# Patient Record
Sex: Female | Born: 1945 | Race: White | Hispanic: No | Marital: Married | Smoking: Never smoker
Health system: Southern US, Community
[De-identification: ages and names within clinical notes are randomized; demographics above are authoritative.]

## PROBLEM LIST (undated history)

## (undated) DIAGNOSIS — C801 Malignant (primary) neoplasm, unspecified: Secondary | ICD-10-CM

## (undated) DIAGNOSIS — E119 Type 2 diabetes mellitus without complications: Secondary | ICD-10-CM

## (undated) DIAGNOSIS — I10 Essential (primary) hypertension: Secondary | ICD-10-CM

## (undated) HISTORY — PX: MANDIBLE FRACTURE SURGERY: SHX706

---

## 2015-11-27 ENCOUNTER — Emergency Department (HOSPITAL_BASED_OUTPATIENT_CLINIC_OR_DEPARTMENT_OTHER): Payer: Medicare Other

## 2015-11-27 ENCOUNTER — Emergency Department (HOSPITAL_BASED_OUTPATIENT_CLINIC_OR_DEPARTMENT_OTHER)
Admission: EM | Admit: 2015-11-27 | Discharge: 2015-11-27 | Disposition: A | Discharge status: Home / Self Care | Payer: Medicare Other | Attending: Emergency Medicine | Admitting: Emergency Medicine

## 2015-11-27 ENCOUNTER — Encounter (HOSPITAL_BASED_OUTPATIENT_CLINIC_OR_DEPARTMENT_OTHER): Payer: Self-pay | Admitting: *Deleted

## 2015-11-27 DIAGNOSIS — Z79899 Other long term (current) drug therapy: Secondary | ICD-10-CM | POA: Diagnosis not present

## 2015-11-27 DIAGNOSIS — E119 Type 2 diabetes mellitus without complications: Secondary | ICD-10-CM | POA: Insufficient documentation

## 2015-11-27 DIAGNOSIS — E860 Dehydration: Secondary | ICD-10-CM | POA: Insufficient documentation

## 2015-11-27 DIAGNOSIS — I10 Essential (primary) hypertension: Secondary | ICD-10-CM | POA: Insufficient documentation

## 2015-11-27 DIAGNOSIS — R42 Dizziness and giddiness: Secondary | ICD-10-CM | POA: Diagnosis present

## 2015-11-27 DIAGNOSIS — Z7982 Long term (current) use of aspirin: Secondary | ICD-10-CM | POA: Insufficient documentation

## 2015-11-27 HISTORY — DX: Malignant (primary) neoplasm, unspecified: C80.1

## 2015-11-27 HISTORY — DX: Essential (primary) hypertension: I10

## 2015-11-27 HISTORY — DX: Type 2 diabetes mellitus without complications: E11.9

## 2015-11-27 LAB — BASIC METABOLIC PANEL
Anion gap: 10 (ref 5–15)
BUN: 15 mg/dL (ref 6–20)
CALCIUM: 9.1 mg/dL (ref 8.9–10.3)
CO2: 23 mmol/L (ref 22–32)
CREATININE: 0.95 mg/dL (ref 0.44–1.00)
Chloride: 101 mmol/L (ref 101–111)
GFR calc Af Amer: >60 mL/min (ref >60)
GFR, EST NON AFRICAN AMERICAN: 60 mL/min — AB (ref >60)
GLUCOSE: 237 mg/dL — AB (ref 65–99)
Potassium: 3.9 mmol/L (ref 3.5–5.1)
Sodium: 134 mmol/L — ABNORMAL LOW (ref 135–145)

## 2015-11-27 LAB — URINALYSIS, ROUTINE W REFLEX MICROSCOPIC
Bilirubin Urine: NEGATIVE
Glucose, UA: 100 mg/dL — AB
HGB URINE DIPSTICK: NEGATIVE
Ketones, ur: NEGATIVE mg/dL
NITRITE: NEGATIVE
Protein, ur: NEGATIVE mg/dL
Specific Gravity, Urine: 1.008 (ref 1.005–1.030)
pH: 6 (ref 5.0–8.0)

## 2015-11-27 LAB — CBC
HCT: 33.8 % — ABNORMAL LOW (ref 36.0–46.0)
Hemoglobin: 11.4 g/dL — ABNORMAL LOW (ref 12.0–15.0)
MCH: 28.9 pg (ref 26.0–34.0)
MCHC: 33.7 g/dL (ref 30.0–36.0)
MCV: 85.8 fL (ref 78.0–100.0)
Platelets: 318 10*3/uL (ref 150–400)
RBC: 3.94 MIL/uL (ref 3.87–5.11)
RDW: 13.9 % (ref 11.5–15.5)
WBC: 8.4 10*3/uL (ref 4.0–10.5)

## 2015-11-27 LAB — HEPATIC FUNCTION PANEL
ALK PHOS: 95 U/L (ref 38–126)
ALT: 12 U/L — AB (ref 14–54)
AST: 25 U/L (ref 15–41)
Albumin: 3.8 g/dL (ref 3.5–5.0)
BILIRUBIN DIRECT: 0.1 mg/dL (ref 0.1–0.5)
Indirect Bilirubin: 0.6 mg/dL (ref 0.3–0.9)
Total Bilirubin: 0.7 mg/dL (ref 0.3–1.2)
Total Protein: 7.2 g/dL (ref 6.5–8.1)

## 2015-11-27 LAB — URINE MICROSCOPIC-ADD ON: RBC / HPF: NONE SEEN RBC/hpf (ref 0–5)

## 2015-11-27 LAB — CBG MONITORING, ED: GLUCOSE-CAPILLARY: 248 mg/dL — AB (ref 65–99)

## 2015-11-27 MED ORDER — ONDANSETRON HCL 4 MG/2ML IJ SOLN
4.0000 mg | Freq: Once | INTRAMUSCULAR | Status: AC
  Administered 2015-11-27: 4 mg via INTRAVENOUS
  Filled 2015-11-27: qty 2

## 2015-11-27 MED ORDER — SODIUM CHLORIDE 0.9 % IV BOLUS (SEPSIS)
1000.0000 mL | Freq: Once | INTRAVENOUS | Status: AC
  Administered 2015-11-27: 1000 mL via INTRAVENOUS

## 2015-11-27 MED ORDER — SODIUM CHLORIDE 0.9 % IV BOLUS (SEPSIS)
2000.0000 mL | Freq: Once | INTRAVENOUS | Status: AC
Start: 2015-11-27 — End: 2015-11-27
  Administered 2015-11-27: 2000 mL via INTRAVENOUS

## 2015-11-27 MED ORDER — MECLIZINE HCL 25 MG PO TABS
25.0000 mg | ORAL_TABLET | Freq: Once | ORAL | Status: AC
  Administered 2015-11-27: 25 mg via ORAL
  Filled 2015-11-27: qty 1

## 2015-11-27 NOTE — ED Provider Notes (Signed)
CSN: [id number]     Arrival date & time 11/27/15  1549 History  By signing my name below, I, Altamease Oiler, attest that this documentation has been prepared under the direction and in the presence of Merrily Pew, MD. Electronically Signed: Altamease Oiler, ED Scribe. 11/27/2015. 7:21 PM   No chief complaint on file.  The history is provided by the patient. No language interpreter was used.   Shari Thompson is a 70 y.o. 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 The sensation has not worsened since initial onset. The dizziness is worse with walking and she feels unsteady on her feet. Associated symptoms include nausea and emesis for 2 days and an episode of chills yesterday. Pt denies any recent falls, fever, headache, chest pain, SOB, cough, abdominal pain, dysuria, increased frequency, hematuria, diarrhea, and rash. No known sick contact or recent suspicious food intake, though she has been eating out. Her blood sugar is controlled with an insulin pump and was 145 at her last check. The pt was seen at an [facility] clinic and referred to the ED for a CT scan of the head. She is visiting the area from [place]. NKDA.   Past Medical History  Diagnosis Date  . Cancer ([facility])   . Hypertension   . Diabetes mellitus without complication [facility])    Past Surgical History  Procedure Laterality Date  . Mandible fracture surgery     No family history on file. Social History  Substance Use Topics  . Smoking status: Never Smoker   . Smokeless tobacco: None  . Alcohol Use: No   OB History    No data available     Review of Systems  Constitutional: Positive for chills. Negative for fever.  Respiratory: Negative for cough and shortness of breath.   Cardiovascular: Negative for chest pain.  Gastrointestinal: Positive for nausea and  vomiting. Negative for abdominal pain, diarrhea and constipation.  Genitourinary: Negative for dysuria, frequency and hematuria.  Skin: Negative for rash.  Neurological: Positive for dizziness.  All other systems reviewed and are negative.     Allergies  Review of patient's allergies indicates no known allergies.  Home Medications   Prior to Admission medications   Medication Sig Start Date End Date Taking? Authorizing Provider  amLODipine (NORVASC) 10 MG tablet Take 10 mg by mouth daily.   Yes Historical Provider, MD  aspirin 81 MG tablet Take 81 mg by mouth daily.   Yes Historical Provider, MD  calcium acetate (PHOSLO) 667 MG capsule Take by mouth 3 (three) times daily with meals.   Yes Historical Provider, MD  clopidogrel (PLAVIX) 75 MG tablet Take 75 mg by mouth daily.   Yes Historical Provider, MD  fluticasone (FLOVENT DISKUS) 50 MCG/BLIST diskus inhaler Inhale 1 puff into the lungs 2 (two) times daily.   Yes Historical Provider, MD  insulin aspart (NOVOLOG) 100 UNIT/ML injection Inject into the skin 3 (three) times daily before meals.   Yes Historical Provider, MD  irbesartan (AVAPRO) 300 MG tablet Take 300 mg by mouth daily.   Yes Historical Provider, MD  Levothyroxine Sodium (SYNTHROID PO) Take by mouth.   Yes Historical Provider, MD  lovastatin (MEVACOR) 20 MG tablet Take 20 mg by mouth at bedtime.   Yes Historical Provider, MD  metoprolol (LOPRESSOR) 100 MG tablet Take 100 mg by mouth 2 (two) times  daily.   Yes Historical Provider, MD  Multiple Vitamin (MULTI VITAMIN PO) Take by mouth.   Yes Historical Provider, MD   BP 128/61 mmHg  Pulse 68  Temp(Src) 97.9 F (36.6 C) (Oral)  Resp 20  Ht 5' 0.5" (1.537 m)  Wt 155 lb (70.308 kg)  BMI 29.76 kg/m2  SpO2 99% Physical Exam  Constitutional: She appears well-developed and well-nourished.  HENT:  Head: Normocephalic and atraumatic.  Neck: Normal range of motion.  Cardiovascular: Normal rate and regular rhythm.    Pulmonary/Chest: No stridor. No respiratory distress.  Abdominal: She exhibits no distension.  Neurological: She is alert.  No altered mental status, able to give full seemingly accurate history.  Face is symmetric, EOM's intact, pupils equal and reactive, vision intact, tongue and uvula midline without deviation Upper and Lower extremity motor 5/5, intact pain perception in distal extremities, 2+ reflexes in biceps, patella and achilles tendons. Finger to nose normal, heel to shin normal. Walks without assistance or evident ataxia.    Nursing note and vitals reviewed.   ED Course  Procedures (including critical care time) DIAGNOSTIC STUDIES: Oxygen Saturation is 99% on RA,  normal by my interpretation.    COORDINATION OF CARE: 4:34 PM Discussed treatment plan which includes lab work and IVF with pt at bedside and pt agreed to plan.  6:29 PM I re-evaluated the patient and provided an update on the results of her lab work. She feels better. Plan for re-evaluation after ambulation in the hall.    Labs Review Labs Reviewed  BASIC METABOLIC PANEL - Abnormal; Notable for the following:    Sodium 134 (*)    Glucose, Bld 237 (*)    GFR calc non Af Amer 60 (*)    All other components within normal limits  CBC - Abnormal; Notable for the following:    Hemoglobin 11.4 (*)    HCT 33.8 (*)    All other components within normal limits  URINALYSIS, ROUTINE W REFLEX MICROSCOPIC (NOT AT [facility]) - Abnormal; Notable for the following:    Glucose, UA 100 (*)    Leukocytes, UA TRACE (*)    All other components within normal limits  HEPATIC FUNCTION PANEL - Abnormal; Notable for the following:    ALT 12 (*)    All other components within normal limits  URINE MICROSCOPIC-ADD ON - Abnormal; Notable for the following:    Squamous Epithelial / LPF 0-5 (*)    Bacteria, UA RARE (*)    All other components within normal limits  CBG MONITORING, ED - Abnormal; Notable for the following:     Glucose-Capillary 248 (*)    All other components within normal limits    Imaging Review Ct Head Wo Contrast  11/27/2015  CLINICAL DATA:  Dizziness for 2 days EXAM: CT HEAD WITHOUT CONTRAST TECHNIQUE: Contiguous axial images were obtained from the base of the skull through the vertex without intravenous contrast. COMPARISON:  None. FINDINGS: Bony calvarium is intact. No gross soft tissue abnormality is seen. Heavy calcification of the vertebral arteries is noted. Mild atrophic changes are seen. No findings to suggest acute hemorrhage, acute infarction or space-occupying mass lesion are noted. IMPRESSION: No acute abnormality noted. Electronically Signed   By: Inez Catalina M.D.   On: 11/27/2015 19:18   I personally reviewed and evaluated these images and lab results as a part of my medical decision-making.   EKG Interpretation   Date/Time:  Monday November 27 2015 16:18:55 EDT Ventricular Rate:  70  PR Interval:  158 QRS Duration: 107 QT Interval:  446 QTC Calculation: 481 R Axis:   14 Text Interpretation:  Sinus rhythm Low voltage, precordial leads  Borderline T abnormalities, anterior leads No significant change since  last tracing Confirmed by [facility] MD, Corene Cornea [phone]) on 11/27/2015 5:15:03 PM      MDM   Final diagnoses:  Dehydration   CT scan done secondary to her history of squamous cell carcinoma and is progressively worsening difficulty walking. However CT scan was negative for any kind of intracranial mass or bleed unlikely. Patient's symptoms improved significantly with a few liters of fluid and this is likely the cause of her symptoms. He was able to walk without any significant lightheadedness.  She will increase her by mouth intake very sure he has a prescription for meclizine which is very anti-medic and can also help with vertigo she has that. Will follow-up with her doctor when she gets home.  New Prescriptions: Discharge Medication List as of 11/27/2015  8:33 PM       I  have personally and contemperaneously reviewed labs and imaging and used in my decision making as above.   A medical screening exam was performed and I feel the patient has had an appropriate workup for their chief complaint at this time and likelihood of emergent condition existing is low and thus workup can continue on an outpatient basis.. Their vital signs are stable. They have been counseled on decision, discharge, follow up and which symptoms necessitate immediate return to the emergency department.  They verbally stated understanding and agreement with plan and discharged in stable condition.   I personally performed the services described in this documentation, which was scribed in my presence. The recorded information has been reviewed and is accurate.     Merrily Pew, MD 11/27/15 2124

## 2015-11-27 NOTE — ED Notes (Signed)
Pt ambulated in hallway. Still dizzy. Pt states she is not as dizzy as before but still dizzy and off balance.

## 2015-11-27 NOTE — ED Notes (Signed)
Pt ambulatory to bathroom with minimal assistance. Pt reports feeling better.

## 2015-11-27 NOTE — ED Notes (Signed)
Pt awaiting CD of CT at this time.

## 2015-11-27 NOTE — ED Notes (Signed)
She was seen at [facility] this am for dizziness and vomited that started yesterday while visiting from out of town. Her MD told her to come here for CT of her head. She is alert and oriented.

## 2015-11-28 ENCOUNTER — Emergency Department (HOSPITAL_COMMUNITY)
Admission: EM | Admit: 2015-11-28 | Discharge: 2015-11-28 | Disposition: A | Discharge status: Home / Self Care | Payer: Medicare Other | Attending: Emergency Medicine | Admitting: Emergency Medicine

## 2015-11-28 ENCOUNTER — Encounter (HOSPITAL_COMMUNITY): Payer: Self-pay | Admitting: Emergency Medicine

## 2015-11-28 DIAGNOSIS — E162 Hypoglycemia, unspecified: Secondary | ICD-10-CM

## 2015-11-28 DIAGNOSIS — Z79899 Other long term (current) drug therapy: Secondary | ICD-10-CM | POA: Diagnosis not present

## 2015-11-28 DIAGNOSIS — Z7982 Long term (current) use of aspirin: Secondary | ICD-10-CM | POA: Insufficient documentation

## 2015-11-28 DIAGNOSIS — Z792 Long term (current) use of antibiotics: Secondary | ICD-10-CM | POA: Diagnosis not present

## 2015-11-28 DIAGNOSIS — E10649 Type 1 diabetes mellitus with hypoglycemia without coma: Secondary | ICD-10-CM | POA: Diagnosis present

## 2015-11-28 DIAGNOSIS — I10 Essential (primary) hypertension: Secondary | ICD-10-CM | POA: Insufficient documentation

## 2015-11-28 LAB — CBC WITH DIFFERENTIAL/PLATELET
BASOS PCT: 0 %
Basophils Absolute: 0 10*3/uL (ref 0.0–0.1)
EOS PCT: 0 %
Eosinophils Absolute: 0 10*3/uL (ref 0.0–0.7)
HEMATOCRIT: 34 % — AB (ref 36.0–46.0)
Hemoglobin: 11.2 g/dL — ABNORMAL LOW (ref 12.0–15.0)
Lymphocytes Relative: 13 %
Lymphs Abs: 0.9 10*3/uL (ref 0.7–4.0)
MCH: 28.4 pg (ref 26.0–34.0)
MCHC: 32.9 g/dL (ref 30.0–36.0)
MCV: 86.3 fL (ref 78.0–100.0)
MONO ABS: 0.4 10*3/uL (ref 0.1–1.0)
MONOS PCT: 5 %
NEUTROS ABS: 6 10*3/uL (ref 1.7–7.7)
Neutrophils Relative %: 82 %
Platelets: 287 10*3/uL (ref 150–400)
RBC: 3.94 MIL/uL (ref 3.87–5.11)
RDW: 14.6 % (ref 11.5–15.5)
WBC: 7.2 10*3/uL (ref 4.0–10.5)

## 2015-11-28 LAB — COMPREHENSIVE METABOLIC PANEL
ALBUMIN: 3.9 g/dL (ref 3.5–5.0)
ALT: 15 U/L (ref 14–54)
ANION GAP: 8 (ref 5–15)
AST: 21 U/L (ref 15–41)
Alkaline Phosphatase: 91 U/L (ref 38–126)
BILIRUBIN TOTAL: 0.9 mg/dL (ref 0.3–1.2)
BUN: 12 mg/dL (ref 6–20)
CO2: 22 mmol/L (ref 22–32)
Calcium: 8.8 mg/dL — ABNORMAL LOW (ref 8.9–10.3)
Chloride: 110 mmol/L (ref 101–111)
Creatinine, Ser: 0.96 mg/dL (ref 0.44–1.00)
GFR calc Af Amer: >60 mL/min (ref >60)
GFR, EST NON AFRICAN AMERICAN: 59 mL/min — AB (ref >60)
GLUCOSE: 191 mg/dL — AB (ref 65–99)
POTASSIUM: 3.5 mmol/L (ref 3.5–5.1)
Sodium: 140 mmol/L (ref 135–145)
TOTAL PROTEIN: 6.8 g/dL (ref 6.5–8.1)

## 2015-11-28 LAB — URINALYSIS, ROUTINE W REFLEX MICROSCOPIC
BILIRUBIN URINE: NEGATIVE
Glucose, UA: 100 mg/dL — AB
Hgb urine dipstick: NEGATIVE
KETONES UR: NEGATIVE mg/dL
NITRITE: NEGATIVE
Protein, ur: NEGATIVE mg/dL
Specific Gravity, Urine: 1.012 (ref 1.005–1.030)
pH: 5.5 (ref 5.0–8.0)

## 2015-11-28 LAB — URINE MICROSCOPIC-ADD ON

## 2015-11-28 LAB — CBG MONITORING, ED
GLUCOSE-CAPILLARY: 147 mg/dL — AB (ref 65–99)
GLUCOSE-CAPILLARY: 235 mg/dL — AB (ref 65–99)
GLUCOSE-CAPILLARY: 276 mg/dL — AB (ref 65–99)
Glucose-Capillary: 218 mg/dL — ABNORMAL HIGH (ref 65–99)

## 2015-11-28 NOTE — ED Notes (Signed)
Bed: WA25 Expected date:  Expected time:  Means of arrival:  Comments: EMS hypoglycemia  

## 2015-11-28 NOTE — Progress Notes (Signed)
Pt states pcp is [facility] [address] [place] [phone] fax [phone] [email or web address]

## 2015-11-28 NOTE — ED Notes (Addendum)
Per [facility] pt from home co hypoglycemia 60, altered mental status, received half of D 50. Alert aand oriented x 4 upon arrival. Hx Type 1 diabetes. Cool diaphoretic and clammy at the scene per EMS. CBG increased to 156 after treatment.

## 2015-11-28 NOTE — Discharge Instructions (Signed)
You were seen and evaluated today for your low blood sugar. Likely this is the reason that your temperature was so low. Please keep a close eye on your glucose at home. Return with any worsening of symptoms or new concerns. Follow-up as closely as possible with her primary care physician and endocrinologist.  Hypoglycemia Hypoglycemia occurs when the glucose in your blood is too low. Glucose is a type of sugar that is your body's main energy source. Hormones, such as insulin and glucagon, control the level of glucose in the blood. Insulin lowers blood glucose and glucagon increases blood glucose. Having too much insulin in your blood stream, or not eating enough food containing sugar, can result in hypoglycemia. Hypoglycemia can happen to people with or without diabetes. It can develop quickly and can be a medical emergency.  CAUSES   Missing or delaying meals.  Not eating enough carbohydrates at meals.  Taking too much diabetes medicine.  Not timing your oral diabetes medicine or insulin doses with meals, snacks, and exercise.  Nausea and vomiting.  Certain medicines.  Severe illnesses, such as hepatitis, kidney disorders, and certain eating disorders.  Increased activity or exercise without eating something extra or adjusting medicines.  Drinking too much alcohol.  A nerve disorder that affects body functions like your heart rate, blood pressure, and digestion (autonomic neuropathy).  A condition where the stomach muscles do not function properly (gastroparesis). Therefore, medicines and food may not absorb properly.  Rarely, a tumor of the pancreas can produce too much insulin. SYMPTOMS   Hunger.  Sweating (diaphoresis).  Change in body temperature.  Shakiness.  Headache.  Anxiety.  Lightheadedness.  Irritability.  Difficulty concentrating.  Dry mouth.  Tingling or numbness in the hands or feet.  Restless sleep or sleep disturbances.  Altered speech and  coordination.  Change in mental status.  Seizures or prolonged convulsions.  Combativeness.  Drowsiness (lethargic).  Weakness.  Increased heart rate or palpitations.  Confusion.  Pale, gray skin color.  Blurred or double vision.  Fainting. DIAGNOSIS  A physical exam and medical history will be performed. Your caregiver may make a diagnosis based on your symptoms. Blood tests and other lab tests may be performed to confirm a diagnosis. Once the diagnosis is made, your caregiver will see if your signs and symptoms go away once your blood glucose is raised.  TREATMENT  Usually, you can easily treat your hypoglycemia when you notice symptoms.  Check your blood glucose. If it is less than 70 mg/dl, take one of the following:   3-4 glucose tablets.    cup juice.    cup regular soda.   1 cup skim milk.   -1 tube of glucose gel.   5-6 hard candies.   Avoid high-fat drinks or food that may delay a rise in blood glucose levels.  Do not take more than the recommended amount of sugary foods, drinks, gel, or tablets. Doing so will cause your blood glucose to go too high.   Wait 10-15 minutes and recheck your blood glucose. If it is still less than 70 mg/dl or below your target range, repeat treatment.   Eat a snack if it is more than 1 hour until your next meal.  There may be a time when your blood glucose may go so low that you are unable to treat yourself at home when you start to notice symptoms. You may need someone to help you. You may even faint or be unable to swallow. If you  cannot treat yourself, someone will need to bring you to the hospital.  [place]  If you have diabetes, follow your diabetes management plan by:  Taking your medicines as directed.  Following your exercise plan.  Following your meal plan. Do not skip meals. Eat on time.  Testing your blood glucose regularly. Check your blood glucose before and after exercise. If you  exercise longer or different than usual, be sure to check blood glucose more frequently.  Wearing your medical alert jewelry that says you have diabetes.  Identify the cause of your hypoglycemia. Then, develop ways to prevent the recurrence of hypoglycemia.  Do not take a hot bath or shower right after an insulin shot.  Always carry treatment with you. Glucose tablets are the easiest to carry.  If you are going to drink alcohol, drink it only with meals.  Tell friends or family members ways to keep you safe during a seizure. This may include removing hard or sharp objects from the area or turning you on your side.  Maintain a healthy weight. SEEK MEDICAL CARE IF:   You are having problems keeping your blood glucose in your target range.  You are having frequent episodes of hypoglycemia.  You feel you might be having side effects from your medicines.  You are not sure why your blood glucose is dropping so low.  You notice a change in vision or a new problem with your vision. SEEK IMMEDIATE MEDICAL CARE IF:   Confusion develops.  A change in mental status occurs.  The inability to swallow develops.  Fainting occurs.   This information is not intended to replace advice given to you by your health care provider. Make sure you discuss any questions you have with your health care provider.   Document Released: 06/10/2005 Document Revised: 06/15/2013 Document Reviewed: 02/14/2015 Elsevier Interactive Patient Education [employer].

## 2015-11-28 NOTE — ED Provider Notes (Signed)
CSN: [id number]     Arrival date & time 11/28/15  [id number] History   First MD Initiated Contact with Patient 11/28/15 [phone]     Chief Complaint  Patient presents with  . Hypoglycemia     (Consider location/radiation/quality/duration/timing/severity/associated sxs/prior Treatment) HPI Comments: 70 year old female with complicated past medical history including brittle diabetes type 1 diagnosed as an adult currently on an insulin pump presents for low blood sugar. The patient was seen at [facility] in the emergency department yesterday for dizziness and vomiting. At that time it was felt the patient was dehydrated and she was rehydrated with IV fluids with great improvement in her symptoms. Per family the patient has not been dizzy at home. This morning when her husband woke up he realized that she was clammy and checked her blood sugar and found it to be 29. Patient only ate one old potato soup last night. Otherwise has not eaten in about 2 days. EMS was called because she was not able to hold down while the juice whenever trying to get her sugar up at her daughter's house. She did receive half an amp of D50 en route with improvement in her mental status.   Past Medical History  Diagnosis Date  . Cancer ([place])   . Hypertension   . Diabetes mellitus without complication [facility])    Past Surgical History  Procedure Laterality Date  . Mandible fracture surgery     No family history on file. Social History  Substance Use Topics  . Smoking status: Never Smoker   . Smokeless tobacco: None  . Alcohol Use: No   OB History    No data available     Review of Systems  Constitutional: Positive for diaphoresis, appetite change and fatigue. Negative for fever.  HENT: Negative for congestion, postnasal drip and rhinorrhea.   Eyes: Negative for visual disturbance.  Respiratory: Negative for cough, chest tightness and shortness of breath.   Cardiovascular: Negative for chest pain and  palpitations.  Gastrointestinal: Positive for vomiting ("spitting out the guava juice"). Negative for nausea, abdominal pain and diarrhea.  Genitourinary: Negative for dysuria, urgency and hematuria.  Musculoskeletal: Negative for myalgias and back pain.  Skin: Negative for rash.  Neurological: Positive for light-headedness. Negative for dizziness, weakness and headaches.  Hematological: Does not bruise/bleed easily.      Allergies  Review of patient's allergies indicates no known allergies.  Home Medications   Prior to Admission medications   Medication Sig Start Date End Date Taking? Authorizing Provider  amLODipine (NORVASC) 10 MG tablet Take 10 mg by mouth daily.   Yes Historical Provider, MD  aspirin 81 MG tablet Take 81 mg by mouth daily.   Yes Historical Provider, MD  Calcium Carbonate (CALCIUM 600 PO) Take 1 tablet by mouth daily.   Yes Historical Provider, MD  cholecalciferol (VITAMIN D) 400 units TABS tablet Take 400 Units by mouth daily.   Yes Historical Provider, MD  clopidogrel (PLAVIX) 75 MG tablet Take 75 mg by mouth daily.   Yes Historical Provider, MD  desloratadine (CLARINEX) 5 MG tablet Take 5 mg by mouth daily.   Yes Historical Provider, MD  fluticasone (FLONASE) 50 MCG/ACT nasal spray Place 1 spray into both nostrils daily.   Yes Historical Provider, MD  furosemide (LASIX) 20 MG tablet Take 20 mg by mouth daily as needed for fluid or edema.   Yes Historical Provider, MD  Insulin Human (INSULIN PUMP) SOLN Inject 0-100 each into the skin continuous as  needed (per CBG). novolog  Basal rate every hour . Dial in rate with meals if needed   Yes Historical Provider, MD  irbesartan (AVAPRO) 300 MG tablet Take 300 mg by mouth daily.   Yes Historical Provider, MD  levothyroxine (SYNTHROID, LEVOTHROID) 88 MCG tablet Take 88 mcg by mouth daily before breakfast.   Yes Historical Provider, MD  lovastatin (MEVACOR) 20 MG tablet Take 20 mg by mouth at bedtime.   Yes Historical  Provider, MD  metoprolol succinate (TOPROL-XL) 100 MG 24 hr tablet Take 100 mg by mouth daily. 09/22/15  Yes Historical Provider, MD  Multiple Vitamin (MULTI VITAMIN PO) Take 1 tablet by mouth daily.    Yes Historical Provider, MD  mupirocin ointment (BACTROBAN) 2 % Apply 1 application topically 2 (two) times daily. Apply to right hallux 11/19/15  Yes Historical Provider, MD  trimethoprim (TRIMPEX) 100 MG tablet Take 100 mg by mouth 2 (two) times daily.   Yes Historical Provider, MD  cephALEXin (KEFLEX) 500 MG capsule Take 1 capsule by mouth 4 (four) times daily. Reported on 11/28/2015 11/19/15   Historical Provider, MD   BP 118/54 mmHg  Pulse 82  Temp(Src) 98.3 F (36.8 C) (Oral)  Resp 16  SpO2 95% Physical Exam  Constitutional: She is oriented to person, place, and time. She appears well-developed and well-nourished. She is cooperative. She is easily aroused. No distress.  HENT:  Head: Normocephalic and atraumatic.  Right Ear: External ear normal.  Left Ear: External ear normal.  Nose: Nose normal.  Mouth/Throat: Oropharynx is clear and moist. No oropharyngeal exudate.  Healed scarring of the lower face consistent with previous surgical history  Eyes: EOM are normal. Pupils are equal, round, and reactive to light.  Neck: Normal range of motion. Neck supple.  Cardiovascular: Normal rate, regular rhythm and intact distal pulses.   Pulmonary/Chest: Effort normal. No respiratory distress. She has no wheezes. She has no rales.  Abdominal: Soft. She exhibits no distension. There is no tenderness.  Musculoskeletal: Normal range of motion. She exhibits no edema or tenderness.  Neurological: She is oriented to person, place, and time and easily aroused. She has normal strength. No cranial nerve deficit or sensory deficit. She exhibits normal muscle tone. Coordination normal.  Skin: Skin is warm and dry. No rash noted. She is not diaphoretic.  Vitals reviewed.   ED Course  Procedures (including  critical care time) Labs Review Labs Reviewed  CBC WITH DIFFERENTIAL/PLATELET - Abnormal; Notable for the following:    Hemoglobin 11.2 (*)    HCT 34.0 (*)    All other components within normal limits  COMPREHENSIVE METABOLIC PANEL - Abnormal; Notable for the following:    Glucose, Bld 191 (*)    Calcium 8.8 (*)    GFR calc non Af Amer 59 (*)    All other components within normal limits  URINALYSIS, ROUTINE W REFLEX MICROSCOPIC (NOT AT [facility]) - Abnormal; Notable for the following:    Glucose, UA 100 (*)    Leukocytes, UA SMALL (*)    All other components within normal limits  URINE MICROSCOPIC-ADD ON - Abnormal; Notable for the following:    Squamous Epithelial / LPF 0-5 (*)    Bacteria, UA RARE (*)    Casts HYALINE CASTS (*)    All other components within normal limits  CBG MONITORING, ED - Abnormal; Notable for the following:    Glucose-Capillary 147 (*)    All other components within normal limits  CBG MONITORING, ED - Abnormal;  Notable for the following:    Glucose-Capillary 235 (*)    All other components within normal limits  CBG MONITORING, ED - Abnormal; Notable for the following:    Glucose-Capillary 218 (*)    All other components within normal limits  CBG MONITORING, ED - Abnormal; Notable for the following:    Glucose-Capillary 276 (*)    All other components within normal limits  CBG MONITORING, ED  CBG MONITORING, ED  CBG MONITORING, ED    Imaging Review Ct Head Wo Contrast  11/27/2015  CLINICAL DATA:  Dizziness for 2 days EXAM: CT HEAD WITHOUT CONTRAST TECHNIQUE: Contiguous axial images were obtained from the base of the skull through the vertex without intravenous contrast. COMPARISON:  None. FINDINGS: Bony calvarium is intact. No gross soft tissue abnormality is seen. Heavy calcification of the vertebral arteries is noted. Mild atrophic changes are seen. No findings to suggest acute hemorrhage, acute infarction or space-occupying mass lesion are noted.  IMPRESSION: No acute abnormality noted. Electronically Signed   By: Inez Catalina M.D.   On: 11/27/2015 19:18   I have personally reviewed and evaluated these images and lab results as part of my medical decision-making.   EKG Interpretation None      MDM  Patient was seen and evaluated in stable condition. Laboratory and imaging results from yesterday reviewed. Notes in Epic also reviewed. Patient improved while in the emergency department. Her blood sugar resolved without further intervention. Patient was severely hypothermic on arrival and was rewarmed. Discussed the case on the phone with the patient's endocrinologist in [place] Dr. Galen Manila who did recommend that the patient may benefit from overnight observation. This was discussed with the patient and her family at bedside. At that time they felt that they could watch her closely at home and requested discharge. Patient's temperature had normalized. She appeared well. She was alert and oriented 3. She had ambulated without difficulty or ataxia. She denied any dizziness. Strict return precautions were discussed at the bedside with the patient and her family. They expressed understanding. Patient was discharged home in stable condition and the care of her husband and daughter. Final diagnoses:  Hypoglycemia    1. Hypoglycemia    Harvel Quale, MD 11/28/15 1517

## 2017-06-09 IMAGING — CT CT HEAD W/O CM
3 series · 16 of 46 positions shown, 19 images · non-contrast
Comparison: None.

CLINICAL DATA: Dizziness for 2 days

EXAM:
CT HEAD WITHOUT CONTRAST
TECHNIQUE: Contiguous axial images were obtained from the base of the skull
through the vertex without intravenous contrast.

[Series 2: head wo · axial · 0.42mm/px · z∈[-168,-48]mm · 10 of 29 slices shown, 13 images]
[im 3/29  brain]
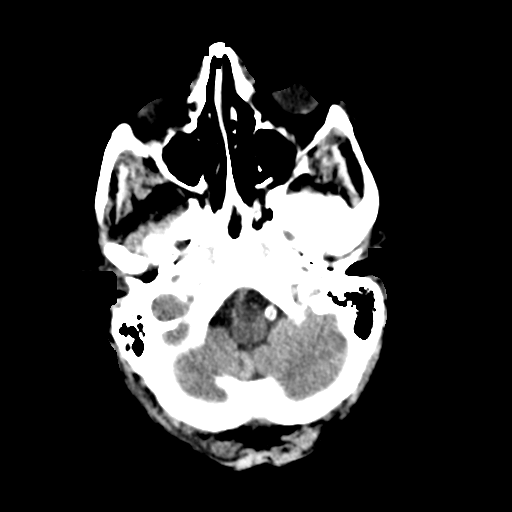
[im 3/29  bone]
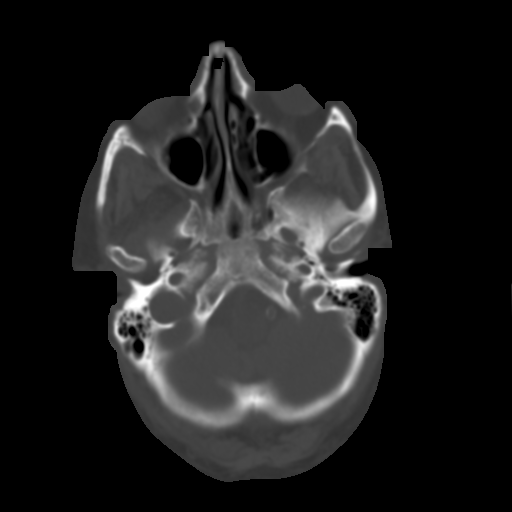
[im 6/29  brain]
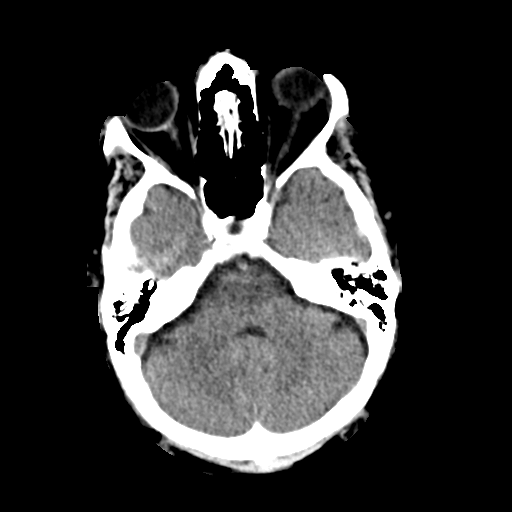
[im 8/29  brain]
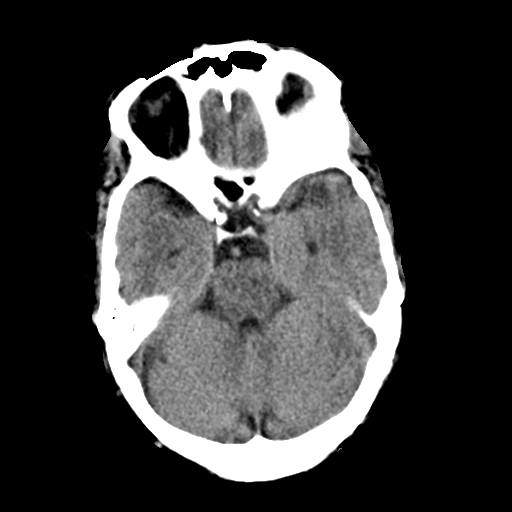
[im 11/29  brain]
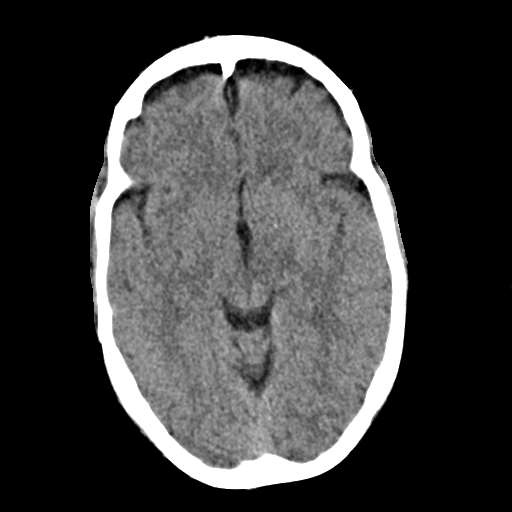
[im 14/29  brain]
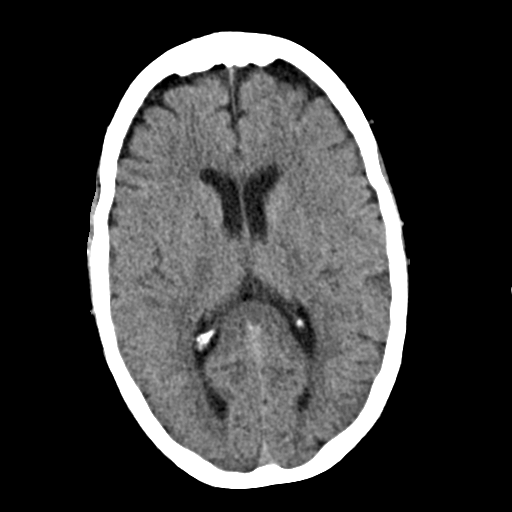
[im 14/29  bone]
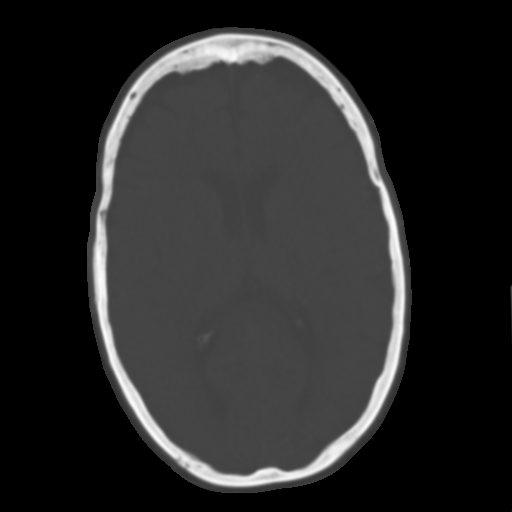
[im 16/29  brain]
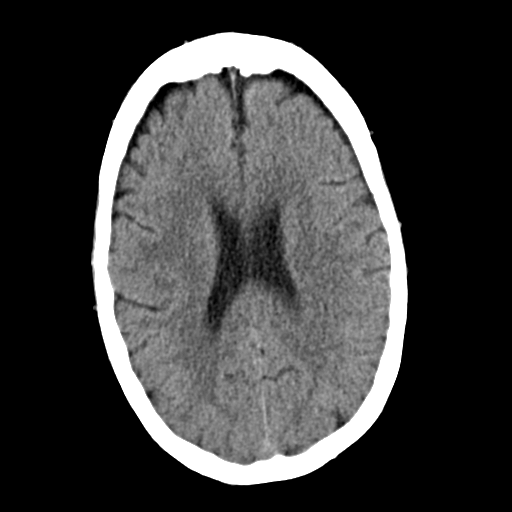
[im 19/29  brain]
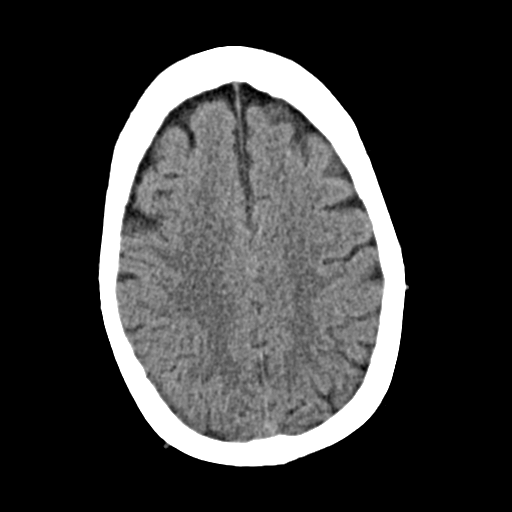
[im 22/29  brain]
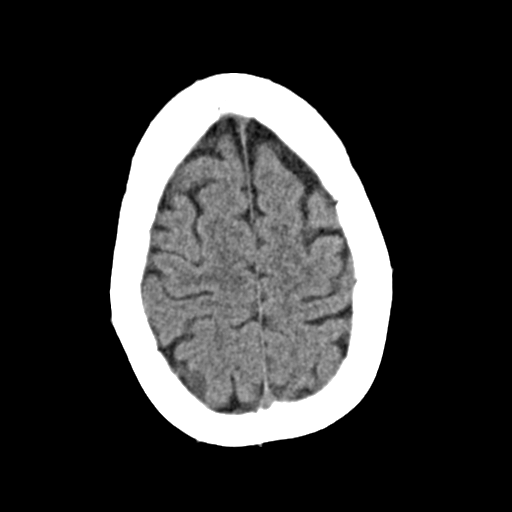
[im 24/29  brain]
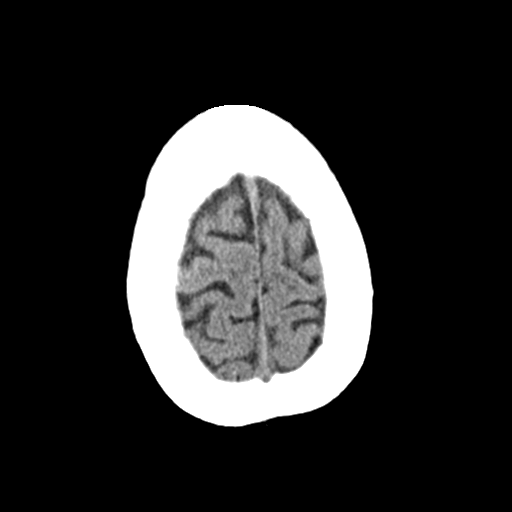
[im 24/29  bone]
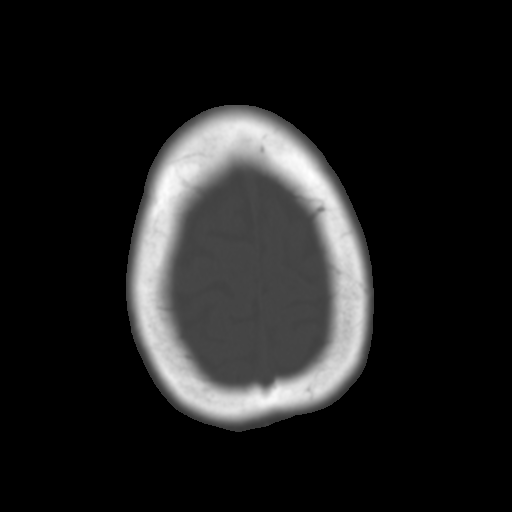
[im 27/29  brain]
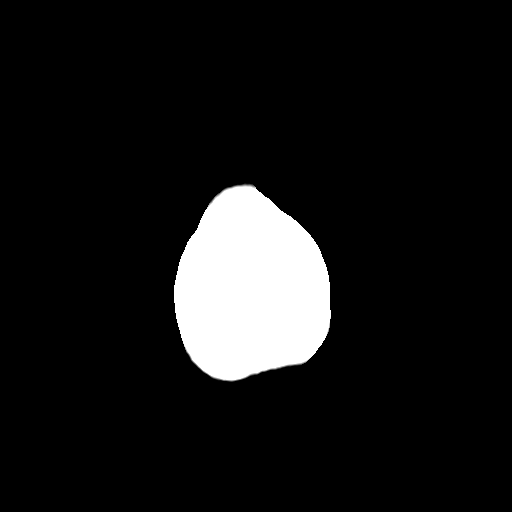

[Series 4: coronal soft · coronal · 0.33mm/px · 3 of 41 slices shown]
[im 14/41  brain]
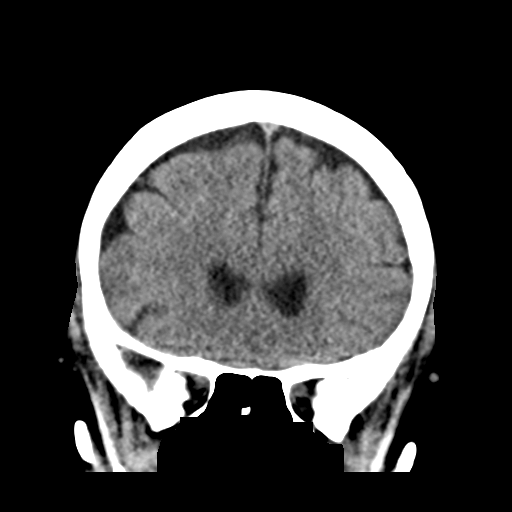
[im 18/41  brain]
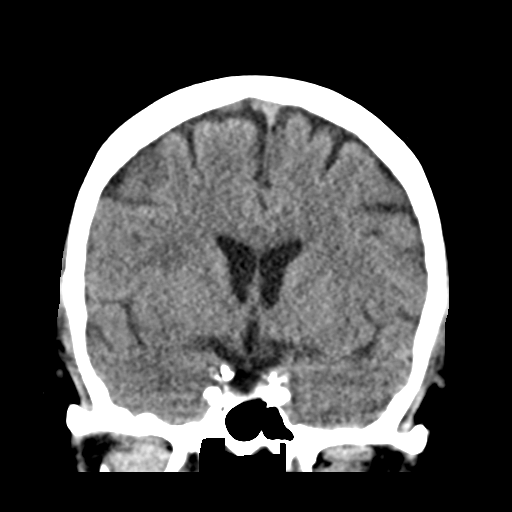
[im 23/41  brain]
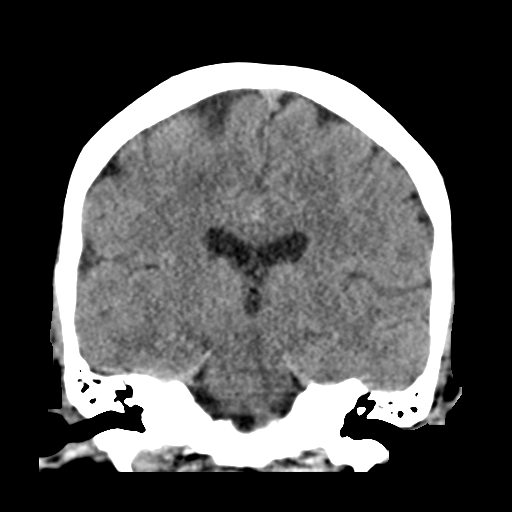

[Series 5: sag soft · sagittal · 0.31mm/px · 3 of 30 slices shown]
[im 10/30  brain]
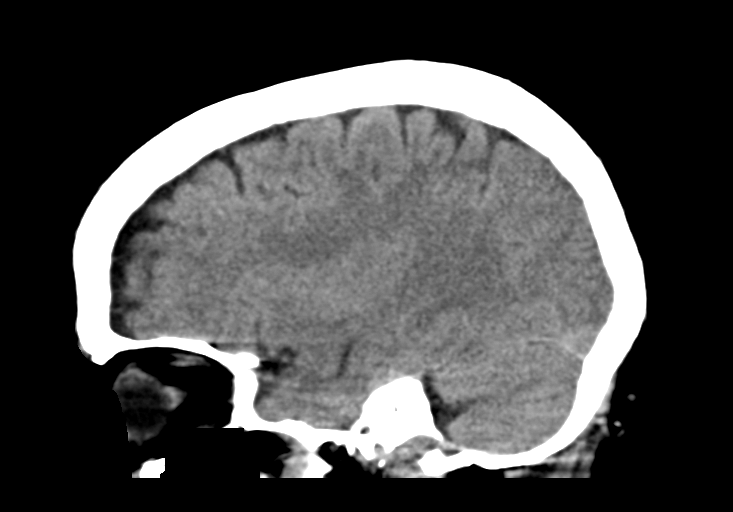
[im 15/30  brain]
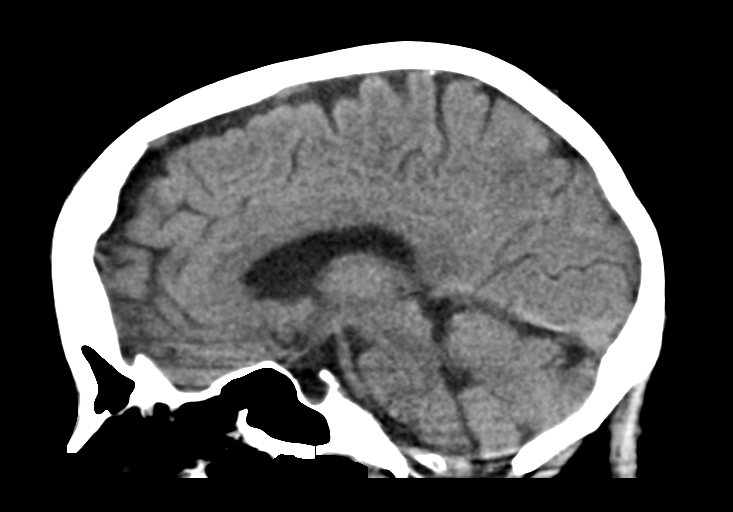
[im 20/30  brain]
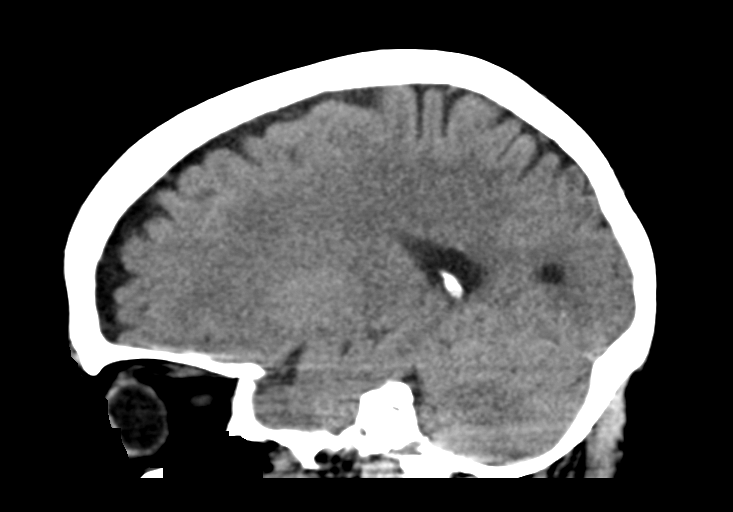

[16 of 46 positions shown; findings below may reference images not displayed]

FINDINGS: Bony calvarium is intact. No gross soft tissue abnormality is seen.
Heavy calcification of the vertebral arteries is noted. Mild
atrophic changes are seen. No findings to suggest acute hemorrhage,
acute infarction or space-occupying mass lesion are noted.
IMPRESSION: No acute abnormality noted.
